# Patient Record
Sex: Male | Born: 1990
Health system: Southern US, Community
[De-identification: ages and names within clinical notes are randomized; demographics above are authoritative.]

## PROBLEM LIST (undated history)

## (undated) DIAGNOSIS — S43006A Unspecified dislocation of unspecified shoulder joint, initial encounter: Secondary | ICD-10-CM

---

## 2013-11-18 ENCOUNTER — Emergency Department (HOSPITAL_BASED_OUTPATIENT_CLINIC_OR_DEPARTMENT_OTHER)

## 2013-11-18 ENCOUNTER — Encounter (HOSPITAL_BASED_OUTPATIENT_CLINIC_OR_DEPARTMENT_OTHER): Payer: Self-pay | Admitting: Emergency Medicine

## 2013-11-18 ENCOUNTER — Emergency Department (HOSPITAL_BASED_OUTPATIENT_CLINIC_OR_DEPARTMENT_OTHER)
Admission: EM | Admit: 2013-11-18 | Discharge: 2013-11-18 | Disposition: A | Discharge status: Home / Self Care | Attending: Emergency Medicine | Admitting: Emergency Medicine

## 2013-11-18 DIAGNOSIS — F17200 Nicotine dependence, unspecified, uncomplicated: Secondary | ICD-10-CM | POA: Insufficient documentation

## 2013-11-18 DIAGNOSIS — S4980XA Other specified injuries of shoulder and upper arm, unspecified arm, initial encounter: Secondary | ICD-10-CM | POA: Diagnosis present

## 2013-11-18 DIAGNOSIS — S46909A Unspecified injury of unspecified muscle, fascia and tendon at shoulder and upper arm level, unspecified arm, initial encounter: Secondary | ICD-10-CM | POA: Diagnosis present

## 2013-11-18 DIAGNOSIS — S4992XA Unspecified injury of left shoulder and upper arm, initial encounter: Secondary | ICD-10-CM

## 2013-11-18 HISTORY — DX: Imprisonment and other incarceration: Z65.1

## 2013-11-18 HISTORY — DX: Unspecified dislocation of unspecified shoulder joint, initial encounter: S43.006A

## 2013-11-18 MED ORDER — IBUPROFEN 800 MG PO TABS
800.0000 mg | ORAL_TABLET | Freq: Once | ORAL | Status: AC
  Administered 2013-11-18: 800 mg via ORAL
  Filled 2013-11-18: qty 1
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
## 2013-11-18 NOTE — ED Notes (Signed)
MD at bedside. 

## 2013-11-18 NOTE — Discharge Instructions (Signed)
Continue to take tylenol and mobic for pain.   Follow up with your doctor.   Wear sling for next 3 days to prevent another dislocation. No heavy lifting for a week.   Return to ER if you have another injury, severe shoulder pain.
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
## 2018-03-06 ENCOUNTER — Emergency Department (HOSPITAL_BASED_OUTPATIENT_CLINIC_OR_DEPARTMENT_OTHER)

## 2018-03-06 ENCOUNTER — Other Ambulatory Visit: Payer: Self-pay

## 2018-03-06 ENCOUNTER — Encounter (HOSPITAL_BASED_OUTPATIENT_CLINIC_OR_DEPARTMENT_OTHER): Payer: Self-pay | Admitting: Emergency Medicine

## 2018-03-06 ENCOUNTER — Emergency Department (HOSPITAL_BASED_OUTPATIENT_CLINIC_OR_DEPARTMENT_OTHER)
Admission: EM | Admit: 2018-03-06 | Discharge: 2018-03-06 | Disposition: A | Discharge status: Home / Self Care | Attending: Emergency Medicine | Admitting: Emergency Medicine

## 2018-03-06 DIAGNOSIS — F17200 Nicotine dependence, unspecified, uncomplicated: Secondary | ICD-10-CM | POA: Insufficient documentation

## 2018-03-06 DIAGNOSIS — M545 Low back pain, unspecified: Secondary | ICD-10-CM

## 2018-03-06 DIAGNOSIS — B9789 Other viral agents as the cause of diseases classified elsewhere: Secondary | ICD-10-CM

## 2018-03-06 DIAGNOSIS — J069 Acute upper respiratory infection, unspecified: Secondary | ICD-10-CM | POA: Insufficient documentation

## 2018-03-06 DIAGNOSIS — R220 Localized swelling, mass and lump, head: Secondary | ICD-10-CM

## 2018-03-06 MED ORDER — IBUPROFEN 800 MG PO TABS: 800.0000 mg | ORAL_TABLET | Freq: Three times a day (TID) | ORAL | 0 refills | Status: AC | PRN

## 2018-03-06 MED ORDER — DIPHENHYDRAMINE HCL 25 MG PO TABS: 25.0000 mg | ORAL_TABLET | Freq: Four times a day (QID) | ORAL | 0 refills | Status: AC | PRN

## 2018-03-06 MED ORDER — PREDNISONE 20 MG PO TABS: 40.0000 mg | ORAL_TABLET | Freq: Every day | ORAL | 0 refills | Status: AC

## 2018-03-06 MED ORDER — PREDNISONE 20 MG PO TABS
40.0000 mg | ORAL_TABLET | Freq: Once | ORAL | Status: AC
  Administered 2018-03-06: 40 mg via ORAL
  Filled 2018-03-06: qty 2

## 2018-03-06 MED ORDER — DIPHENHYDRAMINE HCL 25 MG PO CAPS
25.0000 mg | ORAL_CAPSULE | Freq: Once | ORAL | Status: AC
  Administered 2018-03-06: 25 mg via ORAL
  Filled 2018-03-06: qty 1

## 2018-03-06 MED FILL — BANOPHEN 25 MG CAPSULE: 25 | 25 days supply | Qty: 100 | Fill #0

## 2018-03-06 MED FILL — predniSONE 20 MG TABS: 20 | 4 days supply | Qty: 8 | Fill #0

## 2018-03-06 MED FILL — IBUPROFEN 800 MG TAB: 800 | 7 days supply | Qty: 21 | Fill #0
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
## 2018-03-06 NOTE — Discharge Instructions (Signed)

## 2018-03-06 NOTE — ED Triage Notes (Signed)
Patient states that he has a bump to his left lip - he reports that he has had sweating at night and cough that he can not get rid of, the patient also reports that he has lost weight and having back pain  - patient continues to complain of multiple things during triage

## 2020-03-11 ENCOUNTER — Emergency Department (HOSPITAL_COMMUNITY)
Admission: EM | Admit: 2020-03-11 | Discharge: 2020-03-12 | Disposition: A | Discharge status: Home / Self Care | Payer: PRIVATE HEALTH INSURANCE | Attending: Emergency Medicine | Admitting: Emergency Medicine

## 2020-03-11 ENCOUNTER — Emergency Department (HOSPITAL_COMMUNITY): Payer: PRIVATE HEALTH INSURANCE

## 2020-03-11 ENCOUNTER — Other Ambulatory Visit: Payer: Self-pay

## 2020-03-11 DIAGNOSIS — Z653 Problems related to other legal circumstances: Secondary | ICD-10-CM

## 2020-03-11 DIAGNOSIS — R0602 Shortness of breath: Secondary | ICD-10-CM | POA: Insufficient documentation

## 2020-03-11 DIAGNOSIS — R079 Chest pain, unspecified: Secondary | ICD-10-CM | POA: Insufficient documentation

## 2020-03-11 DIAGNOSIS — F17210 Nicotine dependence, cigarettes, uncomplicated: Secondary | ICD-10-CM | POA: Insufficient documentation

## 2020-03-11 LAB — COMPREHENSIVE METABOLIC PANEL
ALT: 34 U/L (ref 0–44)
AST: 26 U/L (ref 15–41)
Albumin: 4.1 g/dL (ref 3.5–5.0)
Alkaline Phosphatase: 48 U/L (ref 38–126)
Anion gap: 14 (ref 5–15)
BUN: 7 mg/dL (ref 6–20)
CO2: 25 mmol/L (ref 22–32)
Calcium: 8.8 mg/dL — ABNORMAL LOW (ref 8.9–10.3)
Chloride: 104 mmol/L (ref 98–111)
Creatinine, Ser: 1.02 mg/dL (ref 0.61–1.24)
GFR calc Af Amer: >60 mL/min (ref >60)
GFR calc non Af Amer: >60 mL/min (ref >60)
Glucose, Bld: 90 mg/dL (ref 70–99)
Potassium: 3.2 mmol/L — ABNORMAL LOW (ref 3.5–5.1)
Sodium: 143 mmol/L (ref 135–145)
Total Bilirubin: 0.7 mg/dL (ref 0.3–1.2)
Total Protein: 7.1 g/dL (ref 6.5–8.1)

## 2020-03-11 LAB — CBC WITH DIFFERENTIAL/PLATELET
Abs Immature Granulocytes: 0.03 10*3/uL (ref 0.00–0.07)
Basophils Absolute: 0 10*3/uL (ref 0.0–0.1)
Basophils Relative: 0 %
Eosinophils Absolute: 0.1 10*3/uL (ref 0.0–0.5)
Eosinophils Relative: 1 %
HCT: 39.4 % (ref 39.0–52.0)
Hemoglobin: 12.9 g/dL — ABNORMAL LOW (ref 13.0–17.0)
Immature Granulocytes: 0 %
Lymphocytes Relative: 18 %
Lymphs Abs: 1.5 10*3/uL (ref 0.7–4.0)
MCH: 27.7 pg (ref 26.0–34.0)
MCHC: 32.7 g/dL (ref 30.0–36.0)
MCV: 84.7 fL (ref 80.0–100.0)
Monocytes Absolute: 0.6 10*3/uL (ref 0.1–1.0)
Monocytes Relative: 7 %
Neutro Abs: 6 10*3/uL (ref 1.7–7.7)
Neutrophils Relative %: 74 %
Platelets: 231 10*3/uL (ref 150–400)
RBC: 4.65 MIL/uL (ref 4.22–5.81)
RDW: 14.7 % (ref 11.5–15.5)
WBC: 8.2 10*3/uL (ref 4.0–10.5)
nRBC: 0 % (ref 0.0–0.2)

## 2020-03-11 LAB — TROPONIN I (HIGH SENSITIVITY)
Troponin I (High Sensitivity): 4 ng/L (ref <18)
Troponin I (High Sensitivity): 5 ng/L (ref <18)

## 2020-03-11 LAB — ETHANOL: Alcohol, Ethyl (B): 65 mg/dL — ABNORMAL HIGH (ref <10)

## 2020-03-11 LAB — ACETAMINOPHEN LEVEL: Acetaminophen (Tylenol), Serum: <10 ug/mL — ABNORMAL LOW (ref 10–30)

## 2020-03-11 LAB — SALICYLATE LEVEL: Salicylate Lvl: <7 mg/dL — ABNORMAL LOW (ref 7.0–30.0)

## 2020-03-11 MED ORDER — IBUPROFEN 400 MG PO TABS: 600.0000 mg | ORAL_TABLET | Freq: Once | ORAL | Status: DC

## 2020-03-11 MED ORDER — POTASSIUM CHLORIDE CRYS ER 20 MEQ PO TBCR: 40.0000 meq | EXTENDED_RELEASE_TABLET | Freq: Once | ORAL | Status: DC
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
